# Patient Record
Sex: Female | Born: 1970 | Race: White | Hispanic: No
Health system: Southern US, Community
[De-identification: ages and names within clinical notes are randomized; demographics above are authoritative.]

---

## 1999-02-19 DIAGNOSIS — J45909 Unspecified asthma, uncomplicated: Secondary | ICD-10-CM | POA: Insufficient documentation

## 2006-06-14 DIAGNOSIS — Q250 Patent ductus arteriosus: Secondary | ICD-10-CM | POA: Insufficient documentation

## 2016-01-15 DIAGNOSIS — M47812 Spondylosis without myelopathy or radiculopathy, cervical region: Secondary | ICD-10-CM | POA: Insufficient documentation

## 2016-03-05 DIAGNOSIS — M5020 Other cervical disc displacement, unspecified cervical region: Secondary | ICD-10-CM | POA: Insufficient documentation

## 2016-03-05 DIAGNOSIS — M4722 Other spondylosis with radiculopathy, cervical region: Secondary | ICD-10-CM | POA: Insufficient documentation

## 2016-03-05 DIAGNOSIS — M5010 Cervical disc disorder with radiculopathy, unspecified cervical region: Secondary | ICD-10-CM | POA: Insufficient documentation

## 2016-03-20 DIAGNOSIS — J3801 Paralysis of vocal cords and larynx, unilateral: Secondary | ICD-10-CM | POA: Insufficient documentation

## 2016-06-28 DIAGNOSIS — J3800 Paralysis of vocal cords and larynx, unspecified: Secondary | ICD-10-CM | POA: Insufficient documentation

## 2017-05-18 ENCOUNTER — Other Ambulatory Visit: Payer: Self-pay | Admitting: Orthopaedic Surgery

## 2017-05-18 DIAGNOSIS — M546 Pain in thoracic spine: Secondary | ICD-10-CM

## 2017-05-18 DIAGNOSIS — M4322 Fusion of spine, cervical region: Secondary | ICD-10-CM

## 2017-05-30 ENCOUNTER — Ambulatory Visit
Admission: RE | Admit: 2017-05-30 | Discharge: 2017-05-30 | Disposition: A | Discharge status: Home / Self Care | Payer: BC Managed Care – PPO | Source: Ambulatory Visit | Attending: Orthopaedic Surgery | Admitting: Orthopaedic Surgery

## 2017-05-30 DIAGNOSIS — M4322 Fusion of spine, cervical region: Secondary | ICD-10-CM

## 2017-05-30 DIAGNOSIS — M546 Pain in thoracic spine: Secondary | ICD-10-CM

## 2017-05-30 MED ORDER — MEPERIDINE HCL 100 MG/ML IJ SOLN
75.0000 mg | Freq: Once | INTRAMUSCULAR | Status: AC
Start: 2017-05-30 — End: 2017-05-30
  Administered 2017-05-30: 75 mg via INTRAMUSCULAR

## 2017-05-30 MED ORDER — IOPAMIDOL (ISOVUE-M 300) INJECTION 61%
10.0000 mL | Freq: Once | INTRAMUSCULAR | Status: AC | PRN
  Administered 2017-05-30: 10 mL via INTRATHECAL

## 2017-05-30 MED ORDER — ONDANSETRON 8 MG PO TBDP
8.0000 mg | ORAL_TABLET | Freq: Once | ORAL | Status: AC
  Administered 2017-05-30: 8 mg via ORAL

## 2017-05-30 MED ORDER — DIAZEPAM 5 MG PO TABS
10.0000 mg | ORAL_TABLET | Freq: Once | ORAL | Status: AC
  Administered 2017-05-30: 10 mg via ORAL

## 2017-05-30 NOTE — Discharge Instructions (Signed)

## 2019-03-12 DIAGNOSIS — F419 Anxiety disorder, unspecified: Secondary | ICD-10-CM | POA: Insufficient documentation

## 2019-03-12 DIAGNOSIS — B009 Herpesviral infection, unspecified: Secondary | ICD-10-CM | POA: Insufficient documentation

## 2019-06-18 ENCOUNTER — Other Ambulatory Visit: Payer: Self-pay | Admitting: Orthopedic Surgery

## 2019-06-18 DIAGNOSIS — M4322 Fusion of spine, cervical region: Secondary | ICD-10-CM

## 2019-06-18 DIAGNOSIS — M960 Pseudarthrosis after fusion or arthrodesis: Secondary | ICD-10-CM

## 2019-06-19 ENCOUNTER — Telehealth: Payer: Self-pay | Admitting: Nurse Practitioner

## 2019-06-19 NOTE — Telephone Encounter (Signed)
Phone call to patient to verify medication list and allergies for myelogram procedure. Pt instructed to hold trintellix and tramadol for 48hrs prior to myelogram appointment time. Pt verbalized understanding. Pre and post procedure instructions reviewed with pt.

## 2019-06-27 ENCOUNTER — Inpatient Hospital Stay
Admission: RE | Admit: 2019-06-27 | Discharge: 2019-06-27 | Disposition: A | Discharge status: Home / Self Care | Payer: BC Managed Care – PPO | Source: Ambulatory Visit | Attending: Orthopedic Surgery | Admitting: Orthopedic Surgery

## 2019-06-27 ENCOUNTER — Other Ambulatory Visit: Payer: BC Managed Care – PPO

## 2019-06-27 NOTE — Discharge Instructions (Signed)
Myelogram Discharge Instructions  1. Go home and rest quietly for the next 24 hours.  It is important to lie flat for the next 24 hours.  Get up only to go to the restroom.  You may lie in the bed or on a couch on your back, your stomach, your left side or your right side.  You may have one pillow under your head.  You may have pillows between your knees while you are on your side or under your knees while you are on your back.  2. DO NOT drive today.  Recline the seat as far back as it will go, while still wearing your seat belt, on the way home.  3. You may get up to go to the bathroom as needed.  You may sit up for 10 minutes to eat.  You may resume your normal diet and medications unless otherwise indicated.  Drink plenty of extra fluids today and tomorrow.  4. The incidence of a spinal headache with nausea and/or vomiting is about 5% (one in 20 patients).  If you develop a headache, lie flat and drink plenty of fluids until the headache goes away.  Caffeinated beverages may be helpful.  If you develop severe nausea and vomiting or a headache that does not go away with flat bed rest, call [phone].  5. You may resume normal activities after your 24 hours of bed rest is over; however, do not exert yourself strongly or do any heavy lifting tomorrow.  6. Call your physician for a follow-up appointment.    You may resume Tramadol and Trintellix on Thursday, June 28, 2019 after 1:00p.m.

## 2019-07-06 ENCOUNTER — Other Ambulatory Visit: Payer: BC Managed Care – PPO

## 2019-07-27 ENCOUNTER — Ambulatory Visit
Admission: RE | Admit: 2019-07-27 | Discharge: 2019-07-27 | Disposition: A | Discharge status: Home / Self Care | Payer: BC Managed Care – PPO | Source: Ambulatory Visit | Attending: Orthopedic Surgery | Admitting: Orthopedic Surgery

## 2019-07-27 DIAGNOSIS — M4322 Fusion of spine, cervical region: Secondary | ICD-10-CM

## 2019-07-27 DIAGNOSIS — M960 Pseudarthrosis after fusion or arthrodesis: Secondary | ICD-10-CM

## 2019-07-27 MED ORDER — ONDANSETRON HCL 4 MG/2ML IJ SOLN
4.0000 mg | Freq: Once | INTRAMUSCULAR | Status: AC
  Administered 2019-07-27: 14:00:00 4 mg via INTRAMUSCULAR

## 2019-07-27 MED ORDER — DIAZEPAM 5 MG PO TABS
10.0000 mg | ORAL_TABLET | Freq: Once | ORAL | Status: AC
  Administered 2019-07-27: 13:00:00 10 mg via ORAL

## 2019-07-27 MED ORDER — IOPAMIDOL (ISOVUE-M 300) INJECTION 61%: 10.0000 mL | Freq: Once | INTRAMUSCULAR | Status: DC

## 2019-07-27 MED ORDER — MEPERIDINE HCL 50 MG/ML IJ SOLN
50.0000 mg | Freq: Once | INTRAMUSCULAR | Status: AC
  Administered 2019-07-27: 14:00:00 50 mg via INTRAMUSCULAR

## 2019-07-27 NOTE — Progress Notes (Signed)
Pt reports she has been off of her Trintellix and Tramadol for at least 48 hours.

## 2019-07-27 NOTE — Discharge Instructions (Signed)
Myelogram Discharge Instructions  1. Go home and rest quietly for the next 24 hours.  It is important to lie flat for the next 24 hours.  Get up only to go to the restroom.  You may lie in the bed or on a couch on your back, your stomach, your left side or your right side.  You may have one pillow under your head.  You may have pillows between your knees while you are on your side or under your knees while you are on your back.  2. DO NOT drive today.  Recline the seat as far back as it will go, while still wearing your seat belt, on the way home.  3. You may get up to go to the bathroom as needed.  You may sit up for 10 minutes to eat.  You may resume your normal diet and medications unless otherwise indicated.  Drink lots of extra fluids today and tomorrow.  4. The incidence of headache, nausea, or vomiting is about 5% (one in 20 patients).  If you develop a headache, lie flat and drink plenty of fluids until the headache goes away.  Caffeinated beverages may be helpful.  If you develop severe nausea and vomiting or a headache that does not go away with flat bed rest, call [phone].  5. You may resume normal activities after your 24 hours of bed rest is over; however, do not exert yourself strongly or do any heavy lifting tomorrow. If when you get up you have a headache when standing, go back to bed and force fluids for another 24 hours.  6. Call your physician for a follow-up appointment.  The results of your myelogram will be sent directly to your physician by the following day.  7. If you have any questions or if complications develop after you arrive home, please call [phone].  Discharge instructions have been explained to the patient.  The patient, or the person responsible for the patient, fully understands these instructions.  YOU MAY RESTART YOUR TRINTELLIX AND TRAMADOL TOMORROW 07/28/19 AT 1:00PM.

## 2021-03-11 IMAGING — CT CT CERVICAL SPINE W/ CM
2 series · 10 of 14 positions shown, 12 images · non-contrast
Comparison: Cervical and thoracic myelogram 05/30/2017

CLINICAL DATA: Previous cervical fusion. Right-sided neck pain
extending into the upper thoracic spine and both arms.
TECHNIQUE: Contiguous axial images were obtained through the Cervical and
Thoracic spine after the intrathecal infusion of infusion. Coronal
and sagittal reconstructions were obtained of the axial image sets.

[Series 3: cspine soft (person_name) · axial · 0.28mm/px · z∈[-180,-66]mm · 5 of 87 slices shown]
[im 15/87  soft-tissue]
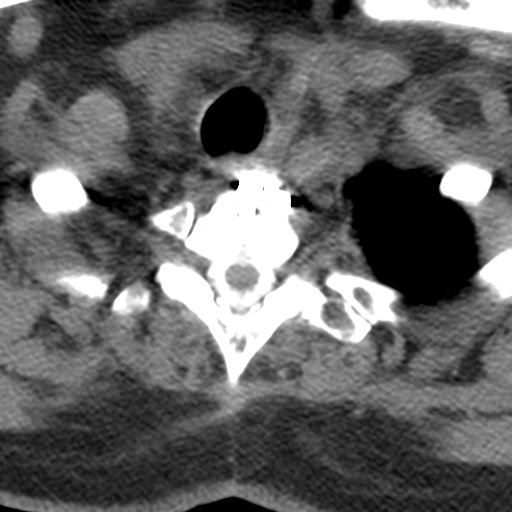
[im 29/87  soft-tissue]
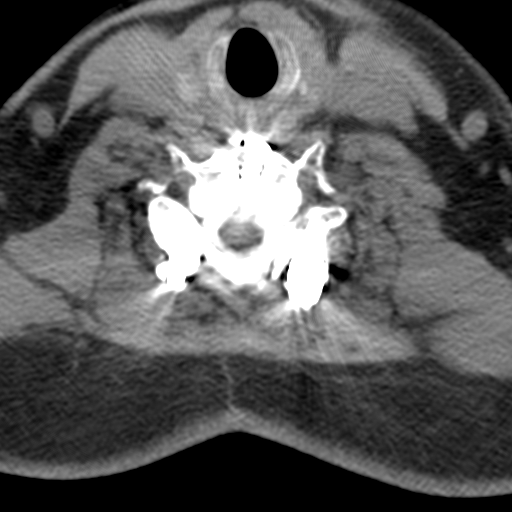
[im 44/87  soft-tissue]
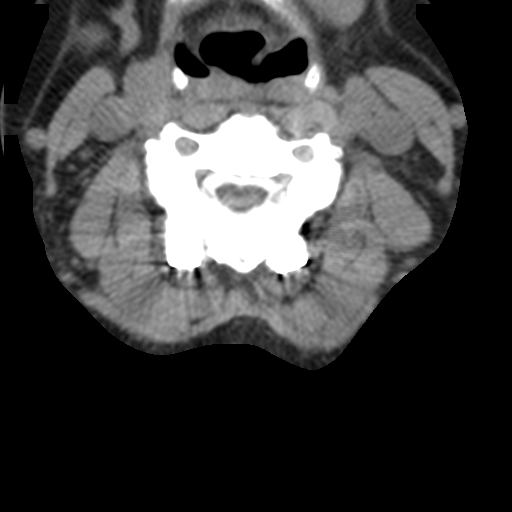
[im 58/87  soft-tissue]
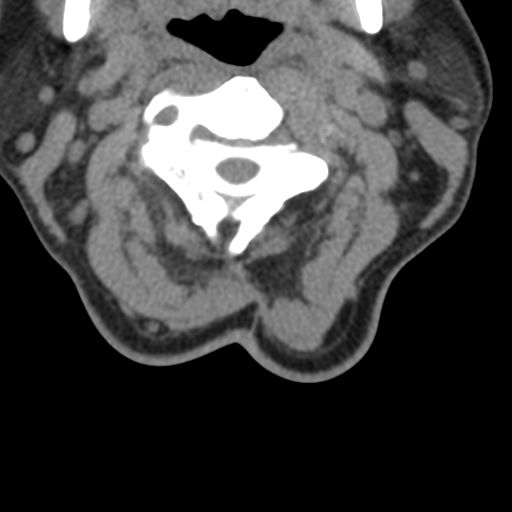
[im 72/87  soft-tissue]
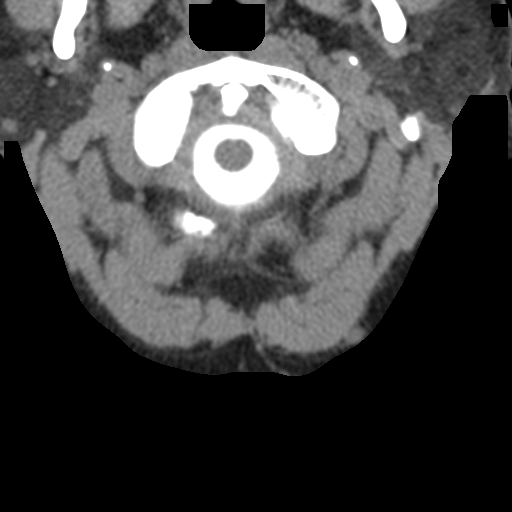

[Series 9: angled axial (person_name) · axial · 0.24mm/px · z∈[-193,-83]mm · 5 of 87 slices shown, 7 images]
[im 15/87  soft-tissue]
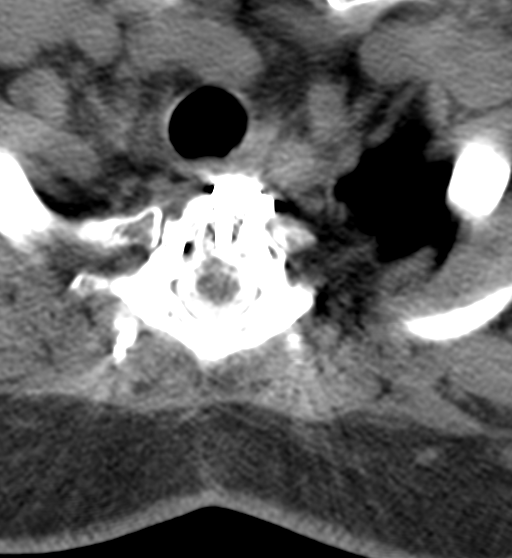
[im 15/87  bone]
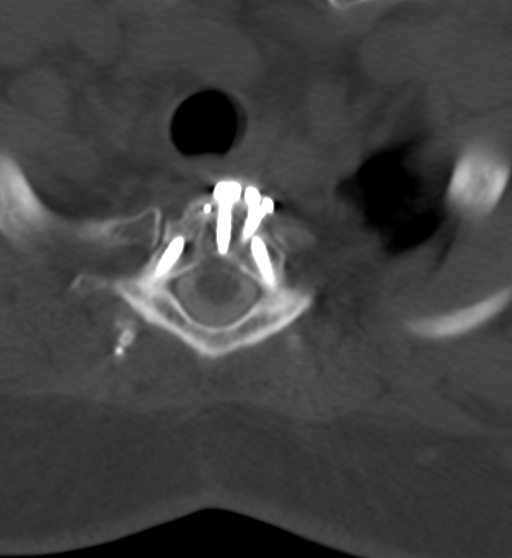
[im 29/87  bone]
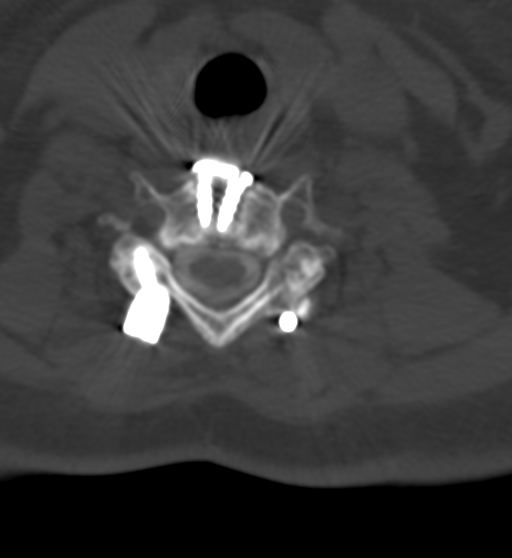
[im 44/87  bone]
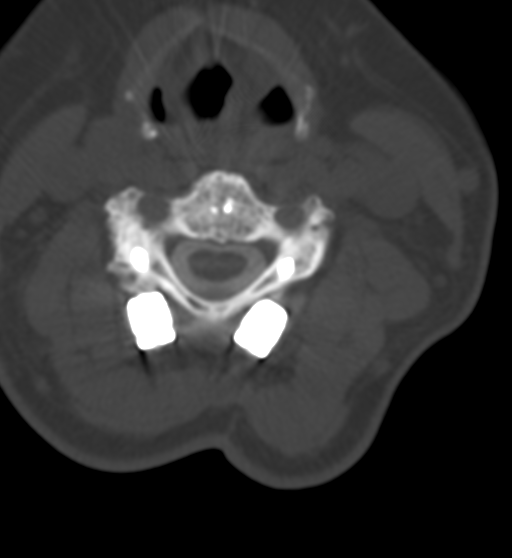
[im 58/87  bone]
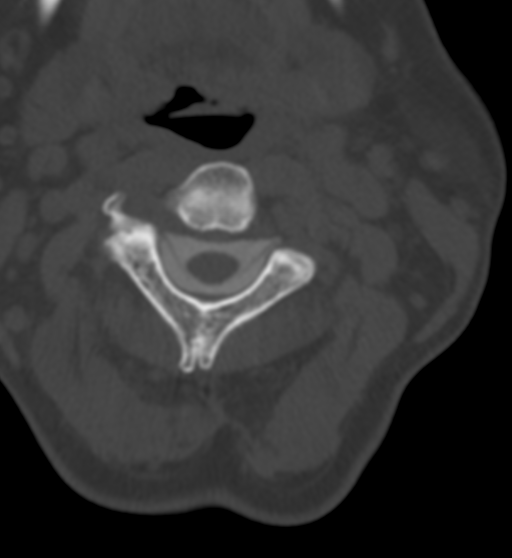
[im 72/87  soft-tissue]
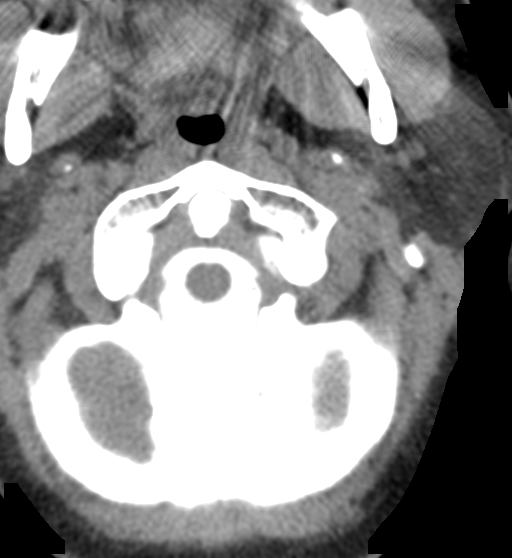
[im 72/87  bone]
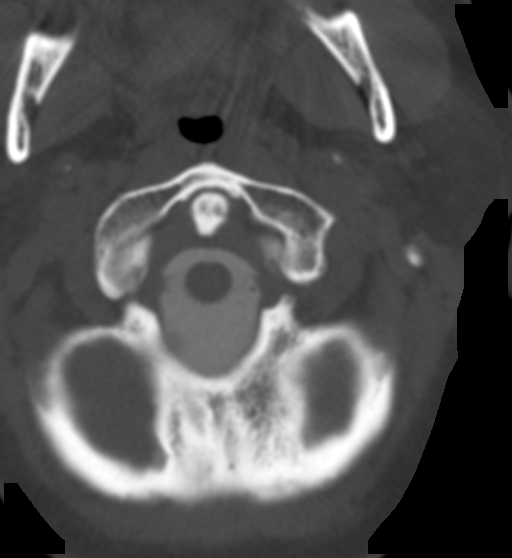

[10 of 14 positions shown; findings below may reference images not displayed]

FLUOROSCOPY TIME:  Fluoroscopy Time: 1 minute 58 seconds

Radiation Exposure Index: 392.87 microGray*m^2

PROCEDURE:
LUMBAR PUNCTURE FOR CERVICAL AND THORACIC MYELOGRAM

After thorough discussion of risks and benefits of the procedure
including bleeding, infection, injury to nerves, blood vessels,
adjacent structures as well as headache and CSF leak, written and
oral informed consent was obtained. Consent was obtained by Dr.
Renier Danger.

Patient was positioned prone on the fluoroscopy table. Local
anesthesia was provided with 1% lidocaine without epinephrine after
prepped and draped in the usual sterile fashion. Puncture was
performed at L3-4 using a 3 1/2 inch 22-gauge spinal needle via a
left interlaminar approach. Using a single pass through the dura,
the needle was placed within the thecal sac, with return of clear
CSF. 10 mL of Isovue M-UJJ was injected into the thecal sac, with
normal opacification of the nerve roots and cauda equina consistent
with free flow within the subarachnoid space. The patient was then
moved to the trendelenburg position and contrast flowed into the
Thoracic spine region.

I personally performed the lumbar puncture and administered the
intrathecal contrast. I also personally supervised acquisition of
the myelogram images.
FINDINGS: CERVICAL AND THORACIC MYELOGRAM FINDINGS:

Sequelae of previous C4-T1 ACDF and interval C3-T1 posterior fusion
are identified. There is trace anterolisthesis of C3 on C4. No
abnormal motion is evident on flexion or extension radiographs.
There is no evidence of significant cervical spinal canal stenosis.
Assessment for nerve root effacement is limited by fusion
instrumentation.

Scattered tiny ventral extradural defects are present in the
thoracic spine without evidence of significant stenosis.

CT CERVICAL MYELOGRAM FINDINGS:

There is cervical spine straightening with unchanged trace
anterolisthesis of C3 on C4 and C7 on T1. Sequelae of C4-T1 ACDF are
again identified with C4-5 and C6-T1 fusion plates remaining in
place. Interbody osseous fusion appears solid at each level
including interval development of solid fusion at C4-5 and C6-7.

There has been interval C3-T1 posterior fusion with solid posterior
arthrodesis bilaterally from C4-T1. There is lucency about the left
greater than right C3 articular pillar screws with the right tip of
the right screw extending into the C3-4 facet joint and without
solid posterior element osseous fusion at C3-4.

No fracture or suspicious osseous lesion is identified. Mild C1-2
osteoarthrosis is unchanged. The cervical spinal cord is normal in
caliber. The paraspinal soft tissues are unremarkable.

C2-3: Minimal uncovertebral spurring and progressive, severe right
facet arthrosis without stenosis.

C3-4: Interval posterior fusion. Mild uncovertebral spurring and
severe right and mild left facet arthrosis result in mild right
neural foraminal stenosis. Facet arthrosis has slightly progressed.
No spinal stenosis.

C4-5: Anterior and posterior fusion. Mild residual osseous neural
foraminal narrowing on the left, improved from prior. No spinal
stenosis.

C5-6: Anterior and posterior fusion.  No stenosis.

C6-7: Anterior and posterior fusion. Moderate residual osseous
neural foraminal stenosis bilaterally, improved from prior. No
spinal stenosis.

C7-T1: Anterior and posterior fusion. Unchanged mild osseous neural
foraminal narrowing bilaterally. No spinal stenosis.

CT THORACIC MYELOGRAM FINDINGS:

There is mild left convex curvature of the upper thoracic spine.
There is no listhesis. No fracture or suspicious osseous lesion is
identified. The thoracic spinal cord is normal in caliber. The
paraspinal soft tissues are unremarkable.

T1-2: Mild facet arthrosis without disc herniation or stenosis,
unchanged.

T2-3: Moderate right greater than left facet arthrosis and minimal
disc bulging without stenosis, unchanged.

T3-4: Shallow right paracentral disc protrusion and mild right and
moderate left facet arthrosis without stenosis, unchanged.

T4-5: Moderate bilateral facet arthrosis without disc herniation or
stenosis, unchanged.

T5-6: Moderate right facet arthrosis without disc herniation or
stenosis, unchanged.

T6-7: Minimal disc bulging without stenosis, unchanged.

T7-8: Mild disc bulging without stenosis, unchanged.

T8-9: Minimal disc bulging and minimal facet arthrosis without
stenosis, unchanged.

T9-10: Minimal facet arthrosis without disc herniation or stenosis,
unchanged.

T10-11: New minimal disc bulging without stenosis.

T11-12: Chronic vacuum disc and minimal disc bulging without
stenosis, unchanged.

T12-L1: Negative.
IMPRESSION: 1. Interval C3-T1 posterior fusion with solid arthrodesis from
C4-T1. Lucency about the C3 articular pillar screws suggesting
loosening without solid C3-4 posterior arthrodesis.
2. Previous C4-T1 ACDF, now with solid fusion at each level.
3. Improved neural foraminal patency at C4-5 and C6-7.
4. Slight progression of severe right facet arthrosis at C3-4 with
chronic mild right neural foraminal stenosis.
5. Progressive severe right facet arthrosis at C2-3 without
stenosis.
6. Mild disc degeneration and moderate facet arthrosis in the
thoracic spine, largely unchanged and without stenosis.

## 2021-03-11 IMAGING — XA DG MYELOGRAPHY LUMBAR INJ MULTI REGION
12 of 18 series · 12 of 18 positions shown · non-contrast
Comparison: Cervical and thoracic myelogram 05/30/2017

CLINICAL DATA: Previous cervical fusion. Right-sided neck pain
extending into the upper thoracic spine and both arms.
TECHNIQUE: Contiguous axial images were obtained through the Cervical and
Thoracic spine after the intrathecal infusion of infusion. Coronal
and sagittal reconstructions were obtained of the axial image sets.

[Series 1: vasc adipose · 1 of 1 slices shown (1 of 10)]
[im 1/1]
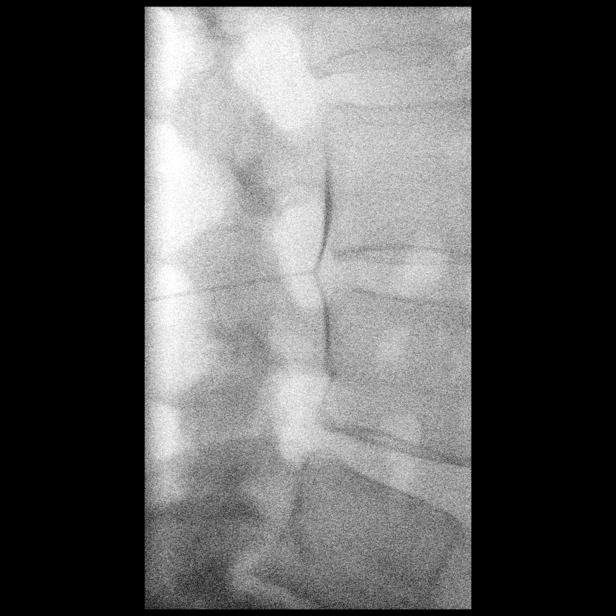

[Series 2: w cervical spine lat · 0.15mm/px · 1 of 1 slices shown]
[im 1/1]
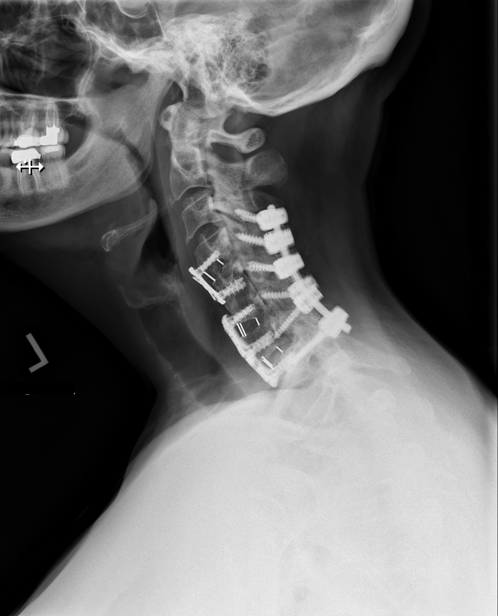

[Series 3: vasc adipose · 1 of 1 slices shown (2 of 10)]
[im 1/1]
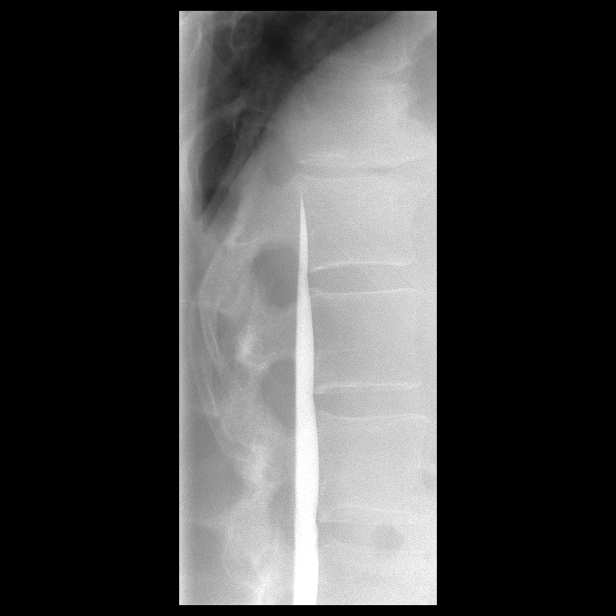

[Series 4: vasc adipose · 1 of 1 slices shown (3 of 10)]
[im 1/1]
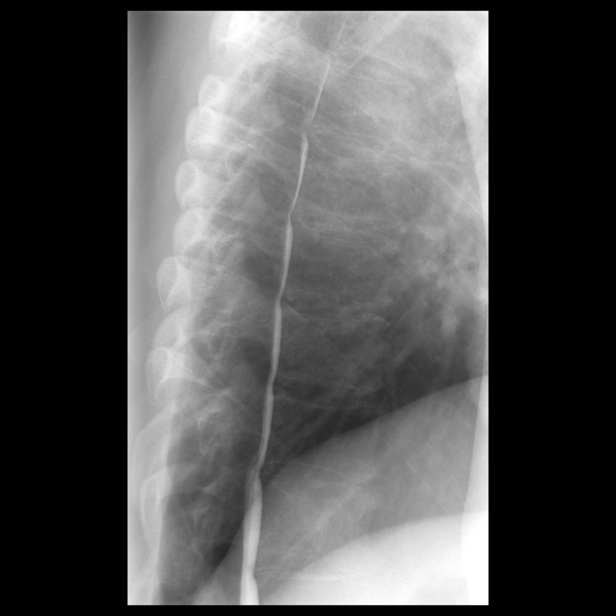

[Series 4: w cervical spine extension · 0.15mm/px · 1 of 1 slices shown]
[im 1/1]
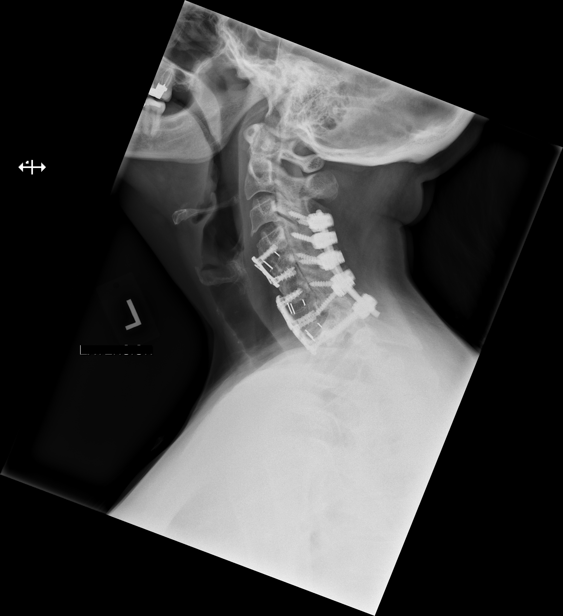

[Series 8: vasc adipose · 1 of 1 slices shown (4 of 10)]
[im 1/1]
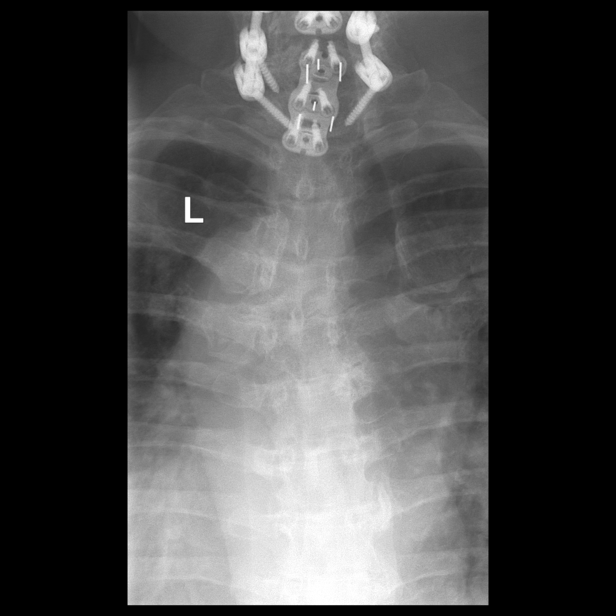

[Series 9: vasc adipose · 1 of 1 slices shown (5 of 10)]
[im 1/1]
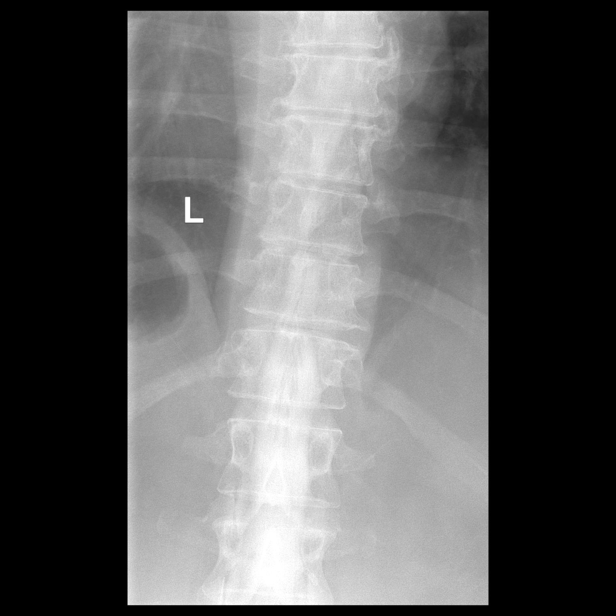

[Series 11: vasc adipose · 1 of 1 slices shown (6 of 10)]
[im 1/1]
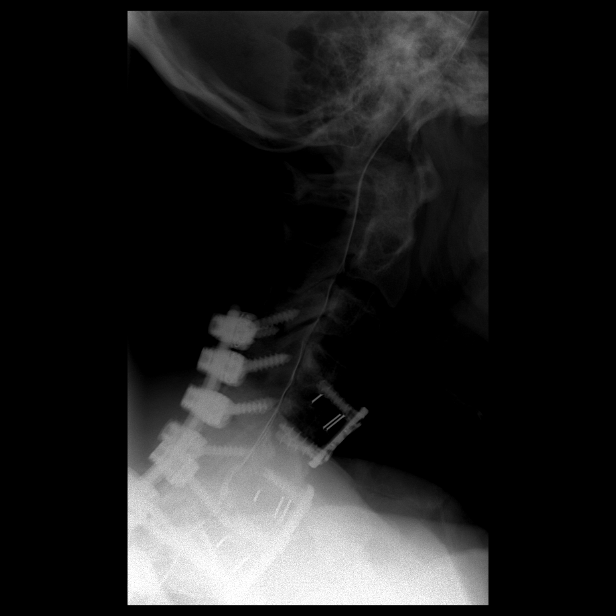

[Series 12: vasc adipose · 1 of 1 slices shown (7 of 10)]
[im 1/1]
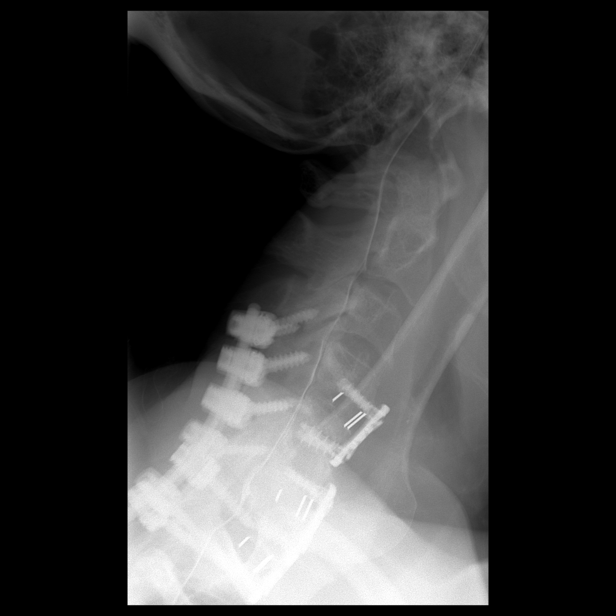

[Series 14: vasc adipose · 1 of 1 slices shown (8 of 10)]
[im 1/1]
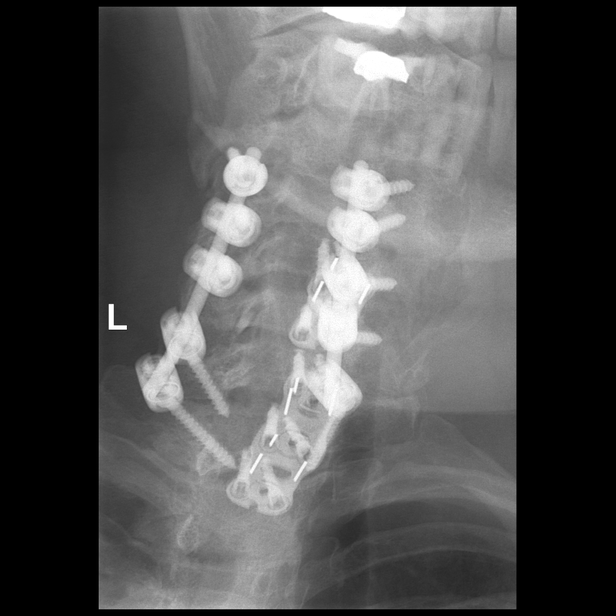

[Series 15: vasc adipose · 1 of 1 slices shown (9 of 10)]
[im 1/1]
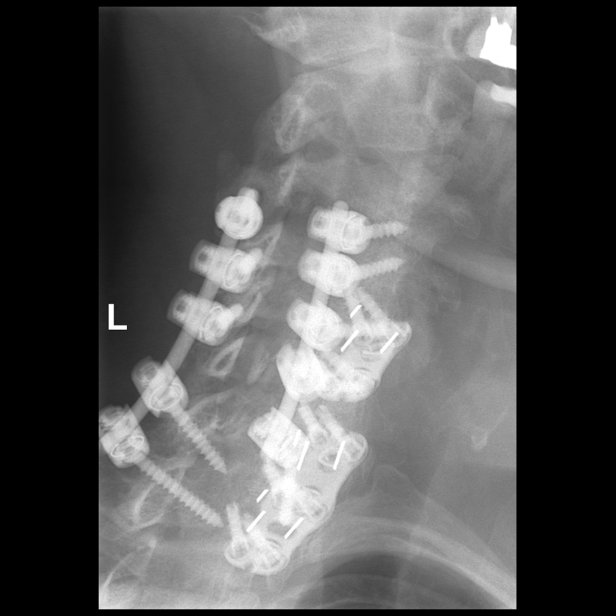

[Series 17: vasc adipose · 1 of 1 slices shown (10 of 10)]
[im 1/1]
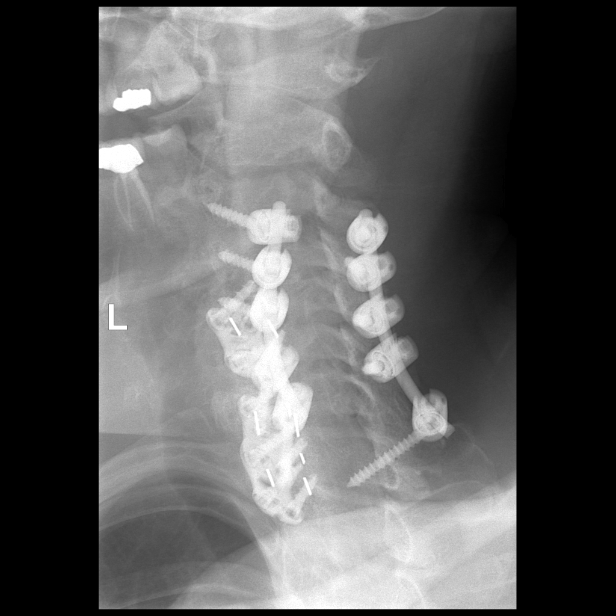

[12 of 18 positions shown; findings below may reference images not displayed]

FLUOROSCOPY TIME:  Fluoroscopy Time: 1 minute 58 seconds

Radiation Exposure Index: 392.87 microGray*m^2

PROCEDURE:
LUMBAR PUNCTURE FOR CERVICAL AND THORACIC MYELOGRAM

After thorough discussion of risks and benefits of the procedure
including bleeding, infection, injury to nerves, blood vessels,
adjacent structures as well as headache and CSF leak, written and
oral informed consent was obtained. Consent was obtained by Dr.
Renier Danger.

Patient was positioned prone on the fluoroscopy table. Local
anesthesia was provided with 1% lidocaine without epinephrine after
prepped and draped in the usual sterile fashion. Puncture was
performed at L3-4 using a 3 1/2 inch 22-gauge spinal needle via a
left interlaminar approach. Using a single pass through the dura,
the needle was placed within the thecal sac, with return of clear
CSF. 10 mL of Isovue M-UJJ was injected into the thecal sac, with
normal opacification of the nerve roots and cauda equina consistent
with free flow within the subarachnoid space. The patient was then
moved to the trendelenburg position and contrast flowed into the
Thoracic spine region.

I personally performed the lumbar puncture and administered the
intrathecal contrast. I also personally supervised acquisition of
the myelogram images.
FINDINGS: CERVICAL AND THORACIC MYELOGRAM FINDINGS:

Sequelae of previous C4-T1 ACDF and interval C3-T1 posterior fusion
are identified. There is trace anterolisthesis of C3 on C4. No
abnormal motion is evident on flexion or extension radiographs.
There is no evidence of significant cervical spinal canal stenosis.
Assessment for nerve root effacement is limited by fusion
instrumentation.

Scattered tiny ventral extradural defects are present in the
thoracic spine without evidence of significant stenosis.

CT CERVICAL MYELOGRAM FINDINGS:

There is cervical spine straightening with unchanged trace
anterolisthesis of C3 on C4 and C7 on T1. Sequelae of C4-T1 ACDF are
again identified with C4-5 and C6-T1 fusion plates remaining in
place. Interbody osseous fusion appears solid at each level
including interval development of solid fusion at C4-5 and C6-7.

There has been interval C3-T1 posterior fusion with solid posterior
arthrodesis bilaterally from C4-T1. There is lucency about the left
greater than right C3 articular pillar screws with the right tip of
the right screw extending into the C3-4 facet joint and without
solid posterior element osseous fusion at C3-4.

No fracture or suspicious osseous lesion is identified. Mild C1-2
osteoarthrosis is unchanged. The cervical spinal cord is normal in
caliber. The paraspinal soft tissues are unremarkable.

C2-3: Minimal uncovertebral spurring and progressive, severe right
facet arthrosis without stenosis.

C3-4: Interval posterior fusion. Mild uncovertebral spurring and
severe right and mild left facet arthrosis result in mild right
neural foraminal stenosis. Facet arthrosis has slightly progressed.
No spinal stenosis.

C4-5: Anterior and posterior fusion. Mild residual osseous neural
foraminal narrowing on the left, improved from prior. No spinal
stenosis.

C5-6: Anterior and posterior fusion.  No stenosis.

C6-7: Anterior and posterior fusion. Moderate residual osseous
neural foraminal stenosis bilaterally, improved from prior. No
spinal stenosis.

C7-T1: Anterior and posterior fusion. Unchanged mild osseous neural
foraminal narrowing bilaterally. No spinal stenosis.

CT THORACIC MYELOGRAM FINDINGS:

There is mild left convex curvature of the upper thoracic spine.
There is no listhesis. No fracture or suspicious osseous lesion is
identified. The thoracic spinal cord is normal in caliber. The
paraspinal soft tissues are unremarkable.

T1-2: Mild facet arthrosis without disc herniation or stenosis,
unchanged.

T2-3: Moderate right greater than left facet arthrosis and minimal
disc bulging without stenosis, unchanged.

T3-4: Shallow right paracentral disc protrusion and mild right and
moderate left facet arthrosis without stenosis, unchanged.

T4-5: Moderate bilateral facet arthrosis without disc herniation or
stenosis, unchanged.

T5-6: Moderate right facet arthrosis without disc herniation or
stenosis, unchanged.

T6-7: Minimal disc bulging without stenosis, unchanged.

T7-8: Mild disc bulging without stenosis, unchanged.

T8-9: Minimal disc bulging and minimal facet arthrosis without
stenosis, unchanged.

T9-10: Minimal facet arthrosis without disc herniation or stenosis,
unchanged.

T10-11: New minimal disc bulging without stenosis.

T11-12: Chronic vacuum disc and minimal disc bulging without
stenosis, unchanged.

T12-L1: Negative.
IMPRESSION: 1. Interval C3-T1 posterior fusion with solid arthrodesis from
C4-T1. Lucency about the C3 articular pillar screws suggesting
loosening without solid C3-4 posterior arthrodesis.
2. Previous C4-T1 ACDF, now with solid fusion at each level.
3. Improved neural foraminal patency at C4-5 and C6-7.
4. Slight progression of severe right facet arthrosis at C3-4 with
chronic mild right neural foraminal stenosis.
5. Progressive severe right facet arthrosis at C2-3 without
stenosis.
6. Mild disc degeneration and moderate facet arthrosis in the
thoracic spine, largely unchanged and without stenosis.

## 2021-08-27 DIAGNOSIS — M25522 Pain in left elbow: Secondary | ICD-10-CM | POA: Insufficient documentation

## 2021-08-27 DIAGNOSIS — M7712 Lateral epicondylitis, left elbow: Secondary | ICD-10-CM | POA: Insufficient documentation

## 2021-08-27 DIAGNOSIS — G5622 Lesion of ulnar nerve, left upper limb: Secondary | ICD-10-CM | POA: Insufficient documentation

## 2022-04-13 DIAGNOSIS — M79605 Pain in left leg: Secondary | ICD-10-CM | POA: Insufficient documentation

## 2022-04-28 LAB — COLOGUARD: COLOGUARD: NEGATIVE

## 2022-06-03 DIAGNOSIS — I639 Cerebral infarction, unspecified: Secondary | ICD-10-CM | POA: Insufficient documentation

## 2022-06-11 DIAGNOSIS — E785 Hyperlipidemia, unspecified: Secondary | ICD-10-CM | POA: Insufficient documentation

## 2022-06-11 DIAGNOSIS — R7303 Prediabetes: Secondary | ICD-10-CM | POA: Insufficient documentation

## 2022-08-19 DIAGNOSIS — Q2112 Patent foramen ovale: Secondary | ICD-10-CM | POA: Insufficient documentation

## 2022-08-19 DIAGNOSIS — Q211 Atrial septal defect, unspecified: Secondary | ICD-10-CM | POA: Insufficient documentation

## 2022-09-01 DIAGNOSIS — Z8774 Personal history of (corrected) congenital malformations of heart and circulatory system: Secondary | ICD-10-CM | POA: Insufficient documentation

## 2022-09-01 DIAGNOSIS — Z9889 Other specified postprocedural states: Secondary | ICD-10-CM | POA: Insufficient documentation

## 2022-11-18 DIAGNOSIS — R002 Palpitations: Secondary | ICD-10-CM | POA: Insufficient documentation

## 2023-08-18 DIAGNOSIS — I471 Supraventricular tachycardia, unspecified: Secondary | ICD-10-CM | POA: Insufficient documentation

## 2023-08-18 DIAGNOSIS — Z8673 Personal history of transient ischemic attack (TIA), and cerebral infarction without residual deficits: Secondary | ICD-10-CM | POA: Insufficient documentation

## 2023-11-12 DIAGNOSIS — F32A Depression, unspecified: Secondary | ICD-10-CM | POA: Insufficient documentation

## 2023-11-12 DIAGNOSIS — I10 Essential (primary) hypertension: Secondary | ICD-10-CM | POA: Insufficient documentation

## 2023-11-12 DIAGNOSIS — M797 Fibromyalgia: Secondary | ICD-10-CM | POA: Insufficient documentation

## 2023-11-12 DIAGNOSIS — I7300 Raynaud's syndrome without gangrene: Secondary | ICD-10-CM | POA: Insufficient documentation

## 2024-04-20 ENCOUNTER — Encounter: Payer: Self-pay | Admitting: Neurosurgery

## 2024-04-20 ENCOUNTER — Ambulatory Visit
Admission: RE | Admit: 2024-04-20 | Discharge: 2024-04-20 | Disposition: A | Discharge status: Home / Self Care | Payer: Self-pay | Source: Ambulatory Visit | Attending: Neurosurgery | Admitting: Neurosurgery

## 2024-04-20 ENCOUNTER — Ambulatory Visit (INDEPENDENT_AMBULATORY_CARE_PROVIDER_SITE_OTHER): Payer: Self-pay | Admitting: Neurosurgery

## 2024-04-20 ENCOUNTER — Other Ambulatory Visit: Payer: Self-pay

## 2024-04-20 VITALS — BP 140/102 | HR 99 | Ht 65.0 in | Wt 147.0 lb

## 2024-04-20 DIAGNOSIS — Z049 Encounter for examination and observation for unspecified reason: Secondary | ICD-10-CM

## 2024-04-20 DIAGNOSIS — M5481 Occipital neuralgia: Secondary | ICD-10-CM

## 2024-04-20 DIAGNOSIS — I639 Cerebral infarction, unspecified: Secondary | ICD-10-CM

## 2024-04-20 NOTE — Progress Notes (Signed)
 53 year old lady with a history of occipital strokes who was found to have a PFO as well as an ASD both of which were closed.  She had some irregularity at the origin of the left V1 and I recommended aspirin and a statin for this.  She returns today and is doing very well.  She has not had any strokelike symptoms.  She has lost 30 pounds in feels very healthy.  Her hemoglobin A1c is in the normal range.  She is having occipital pains which have been diagnosed as occipital neuralgia.  She has tried gabapentin and with this has not helped and has had 2 shots which have helped her for 24 hours.  She wants my opinion on who to go to.  I told her that I would recommend her seeing Dr. Medford Charleston when the [facility] has been successful in managing patients with this condition and I will make a referral.  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

## 2024-04-24 ENCOUNTER — Ambulatory Visit: Admitting: Neurosurgery

## 2024-05-19 ENCOUNTER — Encounter: Payer: Self-pay | Admitting: Neurosurgery

## 2024-05-21 NOTE — Telephone Encounter (Signed)
 Spoke to Dr Leonce office on 12/4- they said they did not receive a referral. I re-faxed the referral and left messages for them 05/18/24 and 05/21/24 to confirm that they received the referral. Awaiting call back
